# Patient Record
Sex: Female | Born: 2000 | Race: White | Hispanic: No | Marital: Single
Health system: Southern US, Community
[De-identification: ages and names within clinical notes are randomized; demographics above are authoritative.]

---
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
## 2018-09-17 IMAGING — CR DG CHEST 2V
2 series · 2 of 2 positions shown · non-contrast
Comparison: None.

CLINICAL DATA: Chest discomfort, popping sensation.

EXAM:
CHEST  2 VIEW

[w chest pa]
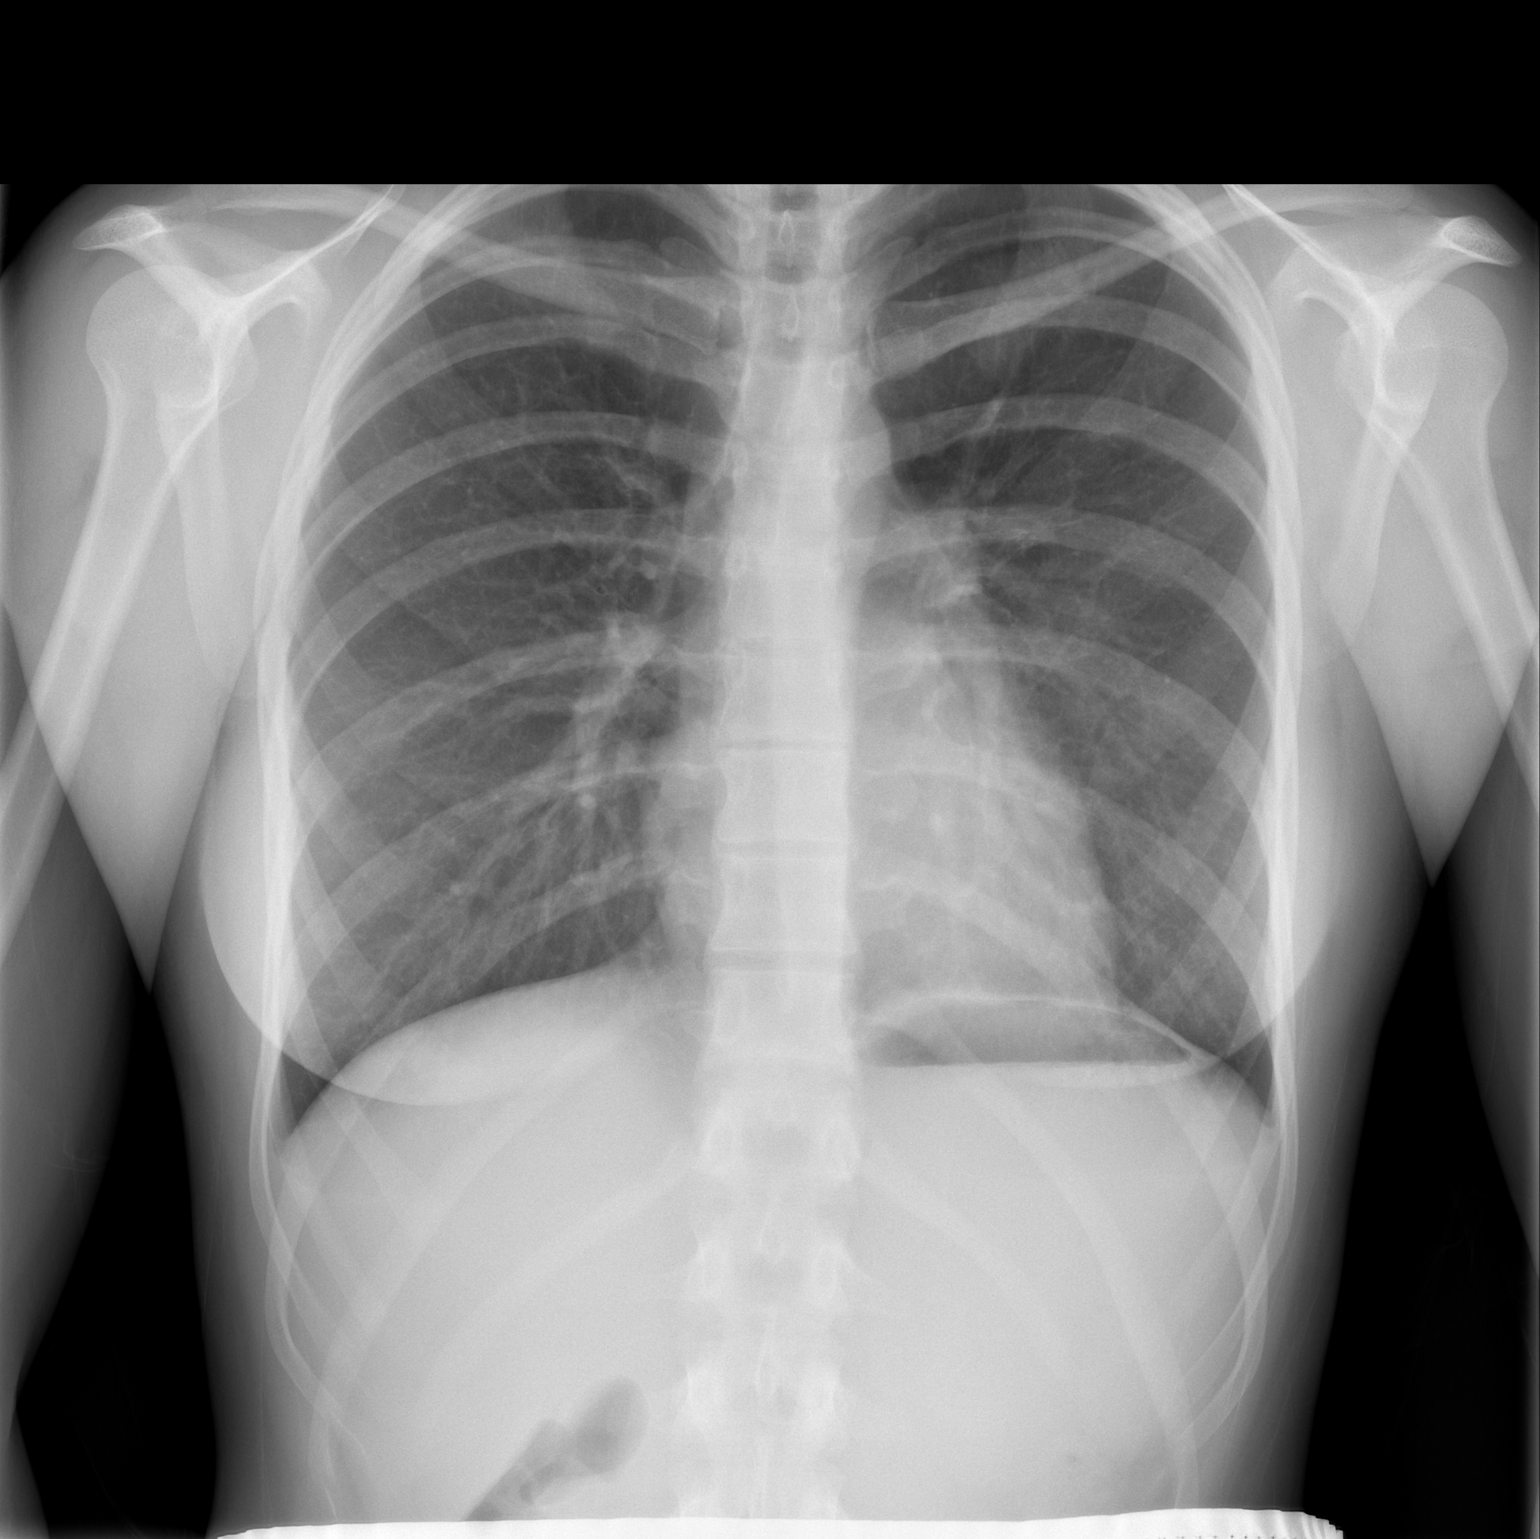

[w chest lat]
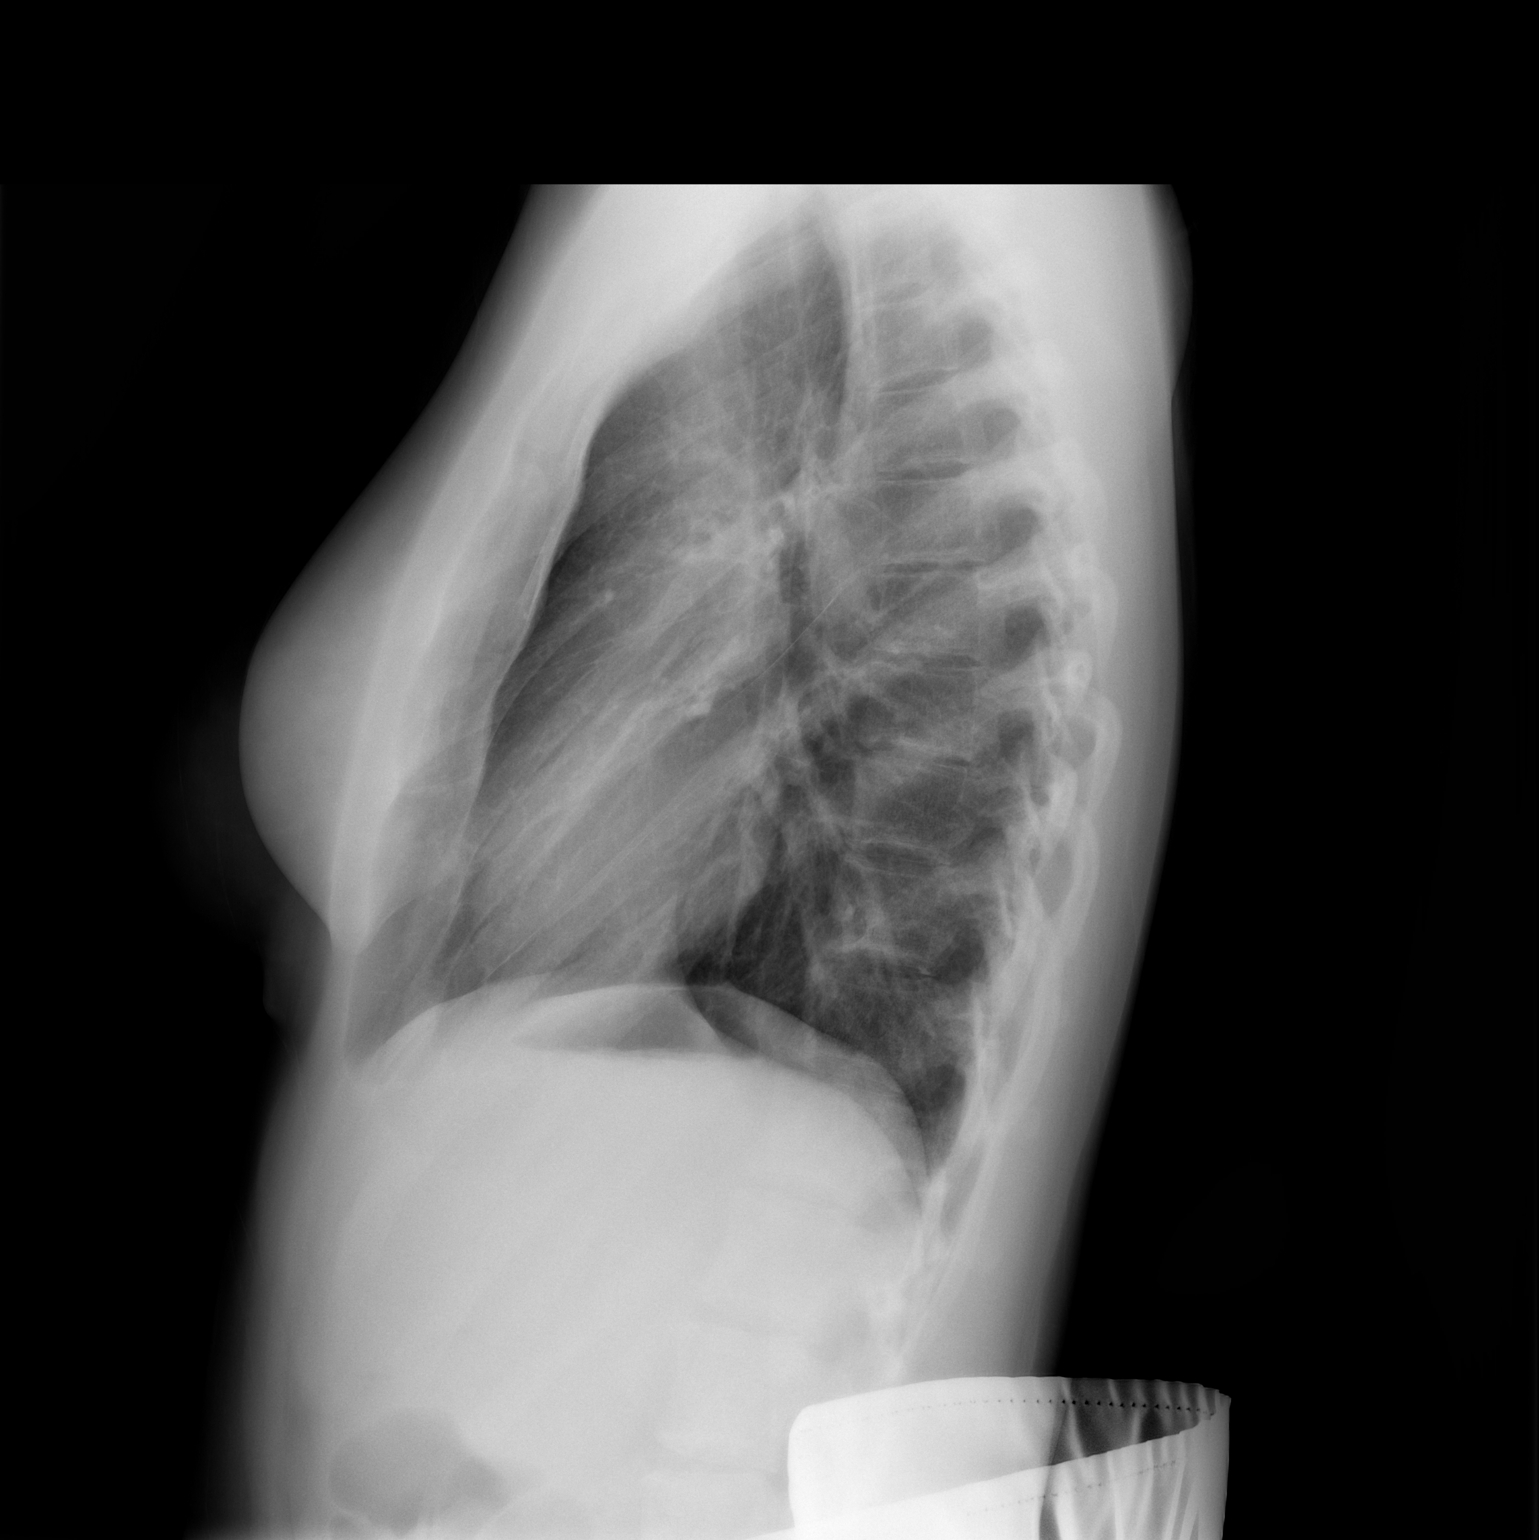

[2 of 2 positions shown; findings below may reference images not displayed]

FINDINGS: The heart size and mediastinal contours are within normal limits.
Both lungs are clear. The visualized skeletal structures are
unremarkable.
IMPRESSION: No active cardiopulmonary disease.

## 2020-01-18 ENCOUNTER — Ambulatory Visit: Payer: Self-pay | Attending: Internal Medicine

## 2020-01-18 DIAGNOSIS — Z23 Encounter for immunization: Secondary | ICD-10-CM

## 2020-01-18 NOTE — Progress Notes (Signed)
   Covid-[facility] Vaccination [facility]  Name:  Brandi Abbott    MRN: [id number] DOB: 02-02-2001  01/18/2020  [name] was observed post Covid-19 immunization for 15 minutes without incident. She was provided with Vaccine Information Sheet and instruction to access the V-Safe system.   Ms. Daily was instructed to call 911 with any severe reactions post vaccine: Marland Kitchen Difficulty breathing  . Swelling of face and throat  . A fast heartbeat  . A bad rash all over body  . Dizziness and weakness   Immunizations Administered    Name Date Dose VIS Date Route   Pfizer COVID-19 Vaccine 01/18/2020  4:36 PM 0.3 mL 10/13/2019 Intramuscular   Manufacturer: [employer], [employer]   Lot: [id number]   NDC: [address]-0

## 2020-02-13 ENCOUNTER — Ambulatory Visit: Payer: Self-pay | Attending: Internal Medicine

## 2020-02-13 DIAGNOSIS — Z23 Encounter for immunization: Secondary | ICD-10-CM

## 2020-02-13 NOTE — Progress Notes (Signed)
   Covid-19 Vaccination Clinic  Name:  Sreenidhi Ganson    MRN: [id number] DOB: 2001-03-05  02/13/2020  Ms. Dearcos was observed post Covid-19 immunization for 15 minutes without incident. She was provided with Vaccine Information Sheet and instruction to access the V-Safe system.   Ms. Hochberg was instructed to call 911 with any severe reactions post vaccine: Marland Kitchen Difficulty breathing  . Swelling of face and throat  . A fast heartbeat  . A bad rash all over body  . Dizziness and weakness   Immunizations Administered    Name Date Dose VIS Date Route   Pfizer COVID-19 Vaccine 02/13/2020 10:57 AM 0.3 mL 10/13/2019 Intramuscular   Manufacturer: [employer], [employer]   Lot: [id number]   NDC: [address]-6
